# Patient Record
Sex: Female | Born: 1990 | Race: White | Hispanic: No | Marital: Married | Smoking: Never smoker
Health system: Southern US, Community
[De-identification: ages and names within clinical notes are randomized; demographics above are authoritative.]

## PROBLEM LIST (undated history)

## (undated) DIAGNOSIS — Z789 Other specified health status: Secondary | ICD-10-CM

## (undated) HISTORY — PX: NO PAST SURGERIES: SHX2092

---
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
## 2010-10-30 LAB — CBC
HCT: 29.9 % — ABNORMAL LOW (ref 36.0–46.0)
Hemoglobin: 10 g/dL — ABNORMAL LOW (ref 12.0–15.0)
Hemoglobin: 10.6 g/dL — ABNORMAL LOW (ref 12.0–15.0)
MCHC: 33.7 g/dL (ref 30.0–36.0)
MCV: 82.4 fL (ref 78.0–100.0)
RBC: 3.62 MIL/uL — ABNORMAL LOW (ref 3.87–5.11)
RBC: 3.87 MIL/uL (ref 3.87–5.11)
WBC: 11.5 10*3/uL — ABNORMAL HIGH (ref 4.0–10.5)
WBC: 8.3 10*3/uL (ref 4.0–10.5)

## 2010-10-30 LAB — CCBB MATERNAL DONOR DRAW

## 2010-12-03 NOTE — H&P (Signed)
NAMENEETI, KNUDTSON         ACCOUNT NO.:  [id number]   MEDICAL RECORD NO.:  [id number]          PATIENT TYPE:  INP   LOCATION:                                FACILITY:  [facility]   PHYSICIAN:  Sherron Monday, MD        DATE OF BIRTH:  06/04/1991   DATE OF ADMISSION:  11/07/2008  DATE OF DISCHARGE:                              HISTORY & PHYSICAL   DIAGNOSIS:  Intrauterine pregnancy at term with favorable cervix.   PROCEDURE PLANNED:  Induction of labor with rupture of membranes and  Pitocin for augmentation.   HISTORY OF PRESENT ILLNESS:  A 20 year old G1, P0 at 32 plus weeks for  induction of labor given favorable cervix and 39 plus weeks gestation.  Her prenatal care has been largely uncomplicated.  She did however have  a late presentation for care at approximately 20 weeks.  She had an 18-  week scan that was consistent with her last menstrual period.  She  states she has had good fetal movement, no loss of fluid, vaginal  bleeding, which she described as spotting only after cervical checks,  occasional contractions.   PAST MEDICAL HISTORY:  Not significant.   PAST SURGICAL HISTORY:  Not significant.   PAST OBSTETRICAL/GYNECOLOGICAL HISTORY:  G1 is the present pregnancy.  No history of any abnormal Pap smears or sexually transmitted diseases.   MEDICATIONS:  Multivitamin.   ALLERGIES:  No known drug allergies.   SOCIAL HISTORY:  Denies alcohol, tobacco, or drug use.   FAMILY HISTORY:  Significant for heart disease in maternal grandfather,  leukemia in mother, anemia in sister and father, paternal aunt with  diabetes, maternal grandmother with breast cancer, maternal great  grandmother with breast cancer.   PRENATAL LABORATORY DATA:  Hemoglobin 12.8, platelets 202,000.  RPR  nonreactive.  Rubella equivocal.  Hepatitis B surface antigen negative.  HIV negative.  Urine culture was positive for E. coli.  Pap smear within  normal limits.  A+ antibody screen negative.   Gonorrhea negative.  Chlamydia negative.  Cystic fibrosis screen negative.  She did not  undergo any screening and she was too late to care.  Ultrasound at 18  and 5 weeks revealed normal anatomy, posterior placenta, female infant.  One-hour Glucola of 88.  Group B strep was negative.  Repeat gonorrhea  and chlamydia were negative.   PHYSICAL EXAMINATION:  VITAL SIGNS:  Stable.  She is afebrile.  GENERAL:  No apparent distress.  CARDIOVASCULAR:  Regular rate and rhythm.  LUNGS:  Clear to auscultation bilaterally.  ABDOMEN:  Soft.  Fundus is nontender.  EXTREMITIES:  Symmetric and nontender.  GU:  Vaginal exam, 3 cm dilated, 80% effaced, and -2 station with a  bulging bag of water somewhat posterior cervix.  Fetal heart tones in  the office were 132 and 140s.   ASSESSMENT:  A 20 year old G1, P0 at 73 plus 4 for induction of labor.  She will have a rupture of membranes and Pitocin to augment her labor,  expect a spontaneous vaginal delivery.  As her rubella was equivocal,  she will get an MMR postpartum.  Sherron Monday, MD  Electronically Signed     JB/MEDQ  D:  11/06/2008  T:  11/07/2008  Job:  [id number]

## 2010-12-03 NOTE — Discharge Summary (Signed)
NAMEBRYLYN, Wanda Kelley         ACCOUNT NO.:  [id number]   MEDICAL RECORD NO.:  [id number]          PATIENT TYPE:  INP   LOCATION:  9105                          [facility]ILITY:  [facility]   PHYSICIAN:  Sherron Monday, MD        DATE OF BIRTH:  June 02, 1991   DATE OF ADMISSION:  11/07/2008  DATE OF DISCHARGE:  11/09/2008                               DISCHARGE SUMMARY   ADMITTING DIAGNOSES:  Intrauterine pregnancy at term, favorable cervix  for induction of labor.   DISCHARGE DIAGNOSES:  Intrauterine pregnancy at term, favorable cervix  for induction of labor, delivered via spontaneous vaginal delivery.   HOSPITAL COURSE:  For complete H&P, please see the dictated note.  However, in brief, a 20 year old G1, P0, at 5 plus weeks for induction  of labor given favorable cervix and 39-week gestational age.  Prenatal  care has been relatively uncomplicated.  She was admitted on the 20th.  AROM was performed for clear fluid and Pitocin was started to augment  her labor.  She progressed without complication to complete +2 and  pushed for approximately 10-15 minutes.  She delivered a viable female  infant at 86 on April 20, Apgars of 8 at minute 9 at 5 minutes and  weight of 8 pounds 5 ounces.  Placenta was expressed intact.  Right  labial and periurethral lacerations were repaired with 3-0 Vicryl Rapide  in a typical fashion.  EBL was less than 500 mL.  Her postpartum course  was relatively uncomplicated.  She remained afebrile.  Vital signs  stable and in general no apparent distress.  On the day of discharge,  she was voiding well, ambulating, having normal lochia.  Her pain was  well controlled.  Her abdomen was soft.  Fundus was firm and nontender  to 3 cm below her umbilicus.  She was discharged home with routine  discharge instructions and numbers to call if any questions or problems,  as well as prescriptions for Motrin, Vicodin, and prenatal vitamins.  She will follow up in 6 weeks.   DISCHARGE INFORMATION:  She is A positive, rubella equivocal.  She is  planning on breast and bottle feeding.   Hemoglobin decreased from 10.6 to 10.0.  She will have an IUD placed at  her postpartum checkup for contraception.      Sherron Monday, MD  Electronically Signed     JB/MEDQ  D:  11/09/2008  T:  11/09/2008  Job:  [id number]

## 2011-03-28 ENCOUNTER — Other Ambulatory Visit: Payer: Self-pay | Admitting: Family Medicine

## 2011-03-28 DIAGNOSIS — N644 Mastodynia: Secondary | ICD-10-CM

## 2011-04-03 ENCOUNTER — Ambulatory Visit
Admission: RE | Admit: 2011-04-03 | Discharge: 2011-04-03 | Disposition: A | Discharge status: Home / Self Care | Payer: 59 | Source: Ambulatory Visit | Attending: Family Medicine | Admitting: Family Medicine

## 2011-04-03 DIAGNOSIS — N644 Mastodynia: Secondary | ICD-10-CM

## 2011-06-11 ENCOUNTER — Other Ambulatory Visit: Payer: Self-pay | Admitting: Family Medicine

## 2011-06-11 DIAGNOSIS — N644 Mastodynia: Secondary | ICD-10-CM

## 2011-06-11 DIAGNOSIS — N6459 Other signs and symptoms in breast: Secondary | ICD-10-CM

## 2011-06-19 ENCOUNTER — Ambulatory Visit
Admission: RE | Admit: 2011-06-19 | Discharge: 2011-06-19 | Disposition: A | Discharge status: Home / Self Care | Payer: 59 | Source: Ambulatory Visit | Attending: Family Medicine | Admitting: Family Medicine

## 2011-06-19 DIAGNOSIS — N6459 Other signs and symptoms in breast: Secondary | ICD-10-CM

## 2011-06-19 DIAGNOSIS — N644 Mastodynia: Secondary | ICD-10-CM

## 2011-11-17 LAB — OB RESULTS CONSOLE GC/CHLAMYDIA
Chlamydia: NEGATIVE
Gonorrhea: NEGATIVE

## 2011-11-17 LAB — OB RESULTS CONSOLE ABO/RH

## 2011-11-17 LAB — OB RESULTS CONSOLE ANTIBODY SCREEN: Antibody Screen: NEGATIVE

## 2012-05-14 LAB — OB RESULTS CONSOLE GBS: GBS: NEGATIVE

## 2012-06-09 ENCOUNTER — Inpatient Hospital Stay (HOSPITAL_COMMUNITY)
Admission: AD | Admit: 2012-06-09 | Discharge: 2012-06-11 | DRG: 774 | Disposition: A | Discharge status: Home / Self Care | Payer: 59 | Source: Ambulatory Visit | Attending: Obstetrics and Gynecology | Admitting: Obstetrics and Gynecology

## 2012-06-09 ENCOUNTER — Encounter (HOSPITAL_COMMUNITY): Payer: Self-pay | Admitting: *Deleted

## 2012-06-09 HISTORY — DX: Other specified health status: Z78.9

## 2012-06-09 NOTE — MAU Note (Signed)
Pt having contractions, denies leaking fluid.

## 2012-06-09 NOTE — MAU Note (Signed)
Pt states contractions started at 2220.

## 2012-06-10 ENCOUNTER — Encounter (HOSPITAL_COMMUNITY): Payer: Self-pay | Admitting: *Deleted

## 2012-06-10 LAB — CBC
MCH: 22.1 pg — ABNORMAL LOW (ref 26.0–34.0)
MCHC: 30.2 g/dL (ref 30.0–36.0)
MCV: 73.3 fL — ABNORMAL LOW (ref 78.0–100.0)
Platelets: 231 10*3/uL (ref 150–400)
RBC: 4.38 MIL/uL (ref 3.87–5.11)
RDW: 16.9 % — ABNORMAL HIGH (ref 11.5–15.5)

## 2012-06-10 LAB — RPR: RPR Ser Ql: NONREACTIVE

## 2012-06-10 MED ORDER — TETANUS-DIPHTH-ACELL PERTUSSIS 5-2.5-18.5 LF-MCG/0.5 IM SUSP: 0.5000 mL | Freq: Once | INTRAMUSCULAR | Status: DC

## 2012-06-10 MED ORDER — LACTATED RINGERS IV SOLN: INTRAVENOUS | Status: AC

## 2012-06-10 MED ORDER — WITCH HAZEL-GLYCERIN EX PADS: 1.0000 "application " | MEDICATED_PAD | CUTANEOUS | Status: DC | PRN

## 2012-06-10 MED ORDER — SENNOSIDES-DOCUSATE SODIUM 8.6-50 MG PO TABS
2.0000 | ORAL_TABLET | Freq: Every day | ORAL | Status: DC
  Administered 2012-06-10: 2 via ORAL

## 2012-06-10 MED ORDER — ONDANSETRON HCL 4 MG PO TABS: 4.0000 mg | ORAL_TABLET | ORAL | Status: DC | PRN

## 2012-06-10 MED ORDER — PRENATAL MULTIVITAMIN CH
1.0000 | ORAL_TABLET | Freq: Every day | ORAL | Status: DC
  Administered 2012-06-10 – 2012-06-11 (×2): 1 via ORAL
  Filled 2012-06-10: qty 1

## 2012-06-10 MED ORDER — DIBUCAINE 1 % RE OINT: 1.0000 "application " | TOPICAL_OINTMENT | RECTAL | Status: DC | PRN

## 2012-06-10 MED ORDER — ONDANSETRON HCL 4 MG/2ML IJ SOLN: 4.0000 mg | INTRAMUSCULAR | Status: DC | PRN

## 2012-06-10 MED ORDER — SIMETHICONE 80 MG PO CHEW: 80.0000 mg | CHEWABLE_TABLET | ORAL | Status: DC | PRN

## 2012-06-10 MED ORDER — LIDOCAINE HCL (PF) 1 % IJ SOLN
INTRAMUSCULAR | Status: AC
  Filled 2012-06-10: qty 30

## 2012-06-10 MED ORDER — MEASLES, MUMPS & RUBELLA VAC ~~LOC~~ INJ
0.5000 mL | INJECTION | Freq: Once | SUBCUTANEOUS | Status: DC
  Filled 2012-06-10: qty 0.5

## 2012-06-10 MED ORDER — BENZOCAINE-MENTHOL 20-0.5 % EX AERO
1.0000 "application " | INHALATION_SPRAY | CUTANEOUS | Status: DC | PRN
  Administered 2012-06-10: 1 via TOPICAL

## 2012-06-10 MED ORDER — LANOLIN HYDROUS EX OINT: TOPICAL_OINTMENT | CUTANEOUS | Status: DC | PRN

## 2012-06-10 MED ORDER — OXYTOCIN 40 UNITS IN LACTATED RINGERS INFUSION - SIMPLE MED
INTRAVENOUS | Status: AC
  Filled 2012-06-10: qty 1000

## 2012-06-10 MED ORDER — OXYCODONE-ACETAMINOPHEN 5-325 MG PO TABS: 1.0000 | ORAL_TABLET | ORAL | Status: DC | PRN

## 2012-06-10 MED ORDER — ZOLPIDEM TARTRATE 5 MG PO TABS: 5.0000 mg | ORAL_TABLET | Freq: Every evening | ORAL | Status: DC | PRN

## 2012-06-10 MED ORDER — OXYTOCIN 40 UNITS IN LACTATED RINGERS INFUSION - SIMPLE MED: 62.5000 mL/h | INTRAVENOUS | Status: AC | PRN

## 2012-06-10 MED ORDER — DIPHENHYDRAMINE HCL 25 MG PO CAPS: 25.0000 mg | ORAL_CAPSULE | Freq: Four times a day (QID) | ORAL | Status: DC | PRN

## 2012-06-10 MED ORDER — IBUPROFEN 600 MG PO TABS
600.0000 mg | ORAL_TABLET | Freq: Four times a day (QID) | ORAL | Status: DC
  Administered 2012-06-10 – 2012-06-11 (×6): 600 mg via ORAL
  Filled 2012-06-10 (×5): qty 1

## 2012-06-10 NOTE — Progress Notes (Signed)
Pt transferred to room 320 via wheelchair, fob at bedside. Newborn in mothers arms.

## 2012-06-10 NOTE — Progress Notes (Addendum)
Patient ID: Wanda Kelley, female   DOB: Jun 02, 1991, 22 y.o.   MRN: [id number] Delivery note:    The pt was seen in MAU and was thought to be 6 cm dilated. She was sent to L&D and shortly after arriving in the room she had ROM and felt the urge to push. She could not control the urge so she rapidly delivered a living female infant over an intact perineum. There was a loop of nuchal cord and the Apgars were 9 and 9 at 1 and 5 minutes. The placenta delivered and several long fragments of membranes were removed with forceps. The uterus felt normal. EBL 400 cc's.

## 2012-06-10 NOTE — H&P (Signed)
NAMESONAM, WANDEL         ACCOUNT NO.:  [id number]  MEDICAL RECORD NO.:  [id number]  LOCATION:  9168                          [facility]ACILITY:  [facility]  PHYSICIAN:  Malachi Pro. Ambrose Mantle, M.D. DATE OF BIRTH:  [birth year]  DATE OF ADMISSION:  06/10/2012 DATE OF DISCHARGE:                             HISTORY & PHYSICAL   PRESENT ILLNESS:  This is a 20 year old white female para 1-0-0-1, Gravida 2, [facility] June 15, 2012, admitted to the labor and delivery room, ready to deliver.  Blood group and type A positive, negative antibody.  Pap smear normal.  Rubella immune.  RPR nonreactive.  Urine culture negative.  Hepatitis B surface antigen negative, HIV negative, GC and Chlamydia negative.  Cystic fibrosis screen negative.  First trimester screen negative.  AFP is not recorded in our chart.  One-hour Glucola was 117.  Group B strep was negative.  The patient had a relatively uncomplicated prenatal course.  She came to the maternity admission unit tonight, was found to be 6 cm dilated, was transferred to labor and delivery.  On admission to labor and delivery, she had rupture of membranes, and was basically ready to deliver.  She could not control her pushing efforts, so she went ahead and delivered shortly after arriving in the bed.  PAST MEDICAL HISTORY:  No significant history.  SURGICAL HISTORY:  Negative.  ALLERGIES:  No known drug allergies.  No latex allergy.  FAMILY HISTORY:  Mother with leukemia.  Father and sister with anemia. Father with diabetes.  OBSTETRIC HISTORY:  In 2010, the patient delivered an 8 pound 5 ounce female infant vaginally.  SOCIAL HISTORY:  She denied alcohol, tobacco, and illicit substance abuse.  PHYSICAL EXAMINATION:  VITAL SIGNS:  On admission, temperature 97.7, pulse was 150, respirations 18, blood pressure 116/52. HEART:  Normal size and sounds.  No murmurs. LUNGS:  Clear to auscultation. ABDOMEN:  Fundal height was normal for term pregnancy.  Cervix 10  cm dilated, vertex on the perineum.  ADMITTING IMPRESSION:  Intrauterine pregnancy at 39+ weeks.  Second stage of labor.  The patient is prepared for delivery.     Malachi Pro. Ambrose Mantle, M.D.     TFH/MEDQ  D:  06/10/2012  T:  06/10/2012  Job:  [id number]

## 2012-06-10 NOTE — Progress Notes (Signed)
Patient ID: Wanda Kelley, female   DOB: [birth year], 21 y.o.   MRN: [id number] DOD no problems

## 2012-06-11 MED ORDER — IBUPROFEN 600 MG PO TABS: 600.0000 mg | ORAL_TABLET | Freq: Four times a day (QID) | ORAL | Status: DC | PRN

## 2012-06-11 MED ORDER — OXYCODONE-ACETAMINOPHEN 5-325 MG PO TABS: 1.0000 | ORAL_TABLET | Freq: Four times a day (QID) | ORAL | Status: DC | PRN

## 2012-06-11 NOTE — Discharge Summary (Signed)
Wanda Kelley, HADLOCK         ACCOUNT NO.:  [id number]  MEDICAL RECORD NO.:  [id number]  LOCATION:  9320                          [facility]ILITY:  [facility]  PHYSICIAN:  Malachi Pro. Ambrose Mantle, M.D. DATE OF BIRTH:  [birth year]  DATE OF ADMISSION:  06/10/2012 DATE OF DISCHARGE:  06/11/2012                              DISCHARGE SUMMARY   A 21 year old white female, para 1-0-0-1, gravida 2, [facility] June 15, 2012, admitted to the hospital, ready to deliver.  She was found to be 6 cm in maternity admission unit, was transferred to labor and delivery, was found to be fully dilated with the head on the perineum after rupture of membranes.  She could not control her urge to push, so rapid delivery of living female infant over an intact perineum.  There was a loop of nuchal cord.  Apgars were 9 and 9 at 1 and 5 minutes.  The placenta delivered and several long fragments of membranes were removed with forceps.  The uterus felt normal.  Blood loss about 400 mL. Postpartum the patient did well.  Dr. Ambrose Mantle was in attendance and the patient was discharged at her request on the first postpartum day. Hemoglobin was 9.7, hematocrit 32.1, white count 17,500, platelet count 231,000.  FINAL DIAGNOSES:  Intrauterine pregnancy at 39+ weeks, delivered vertex.  OPERATION:  Spontaneous delivery vertex.  FINAL CONDITION:  Improved.  Instructions include our regular discharge instruction booklet as well as the after visit summary, Motrin 600 mg, 15 tablets 1 every 6 hours as needed for pain and Percocet 5/325, 15 tablets, 1 every 6 hours as needed for pain given at discharge.  The patient is advised to take iron every day and return to the office in 6 weeks for followup examination.     Malachi Pro. Ambrose Mantle, M.D.     TFH/MEDQ  D:  06/11/2012  T:  06/11/2012  Job:  [id number]

## 2012-06-11 NOTE — Progress Notes (Signed)
Patient ID: Wanda Kelley, female   DOB: 05/20/1991, 21 y.o.   MRN: [id number] #1 afebrile BP normal for d/c

## 2014-05-22 ENCOUNTER — Encounter (HOSPITAL_COMMUNITY): Payer: Self-pay | Admitting: *Deleted

## 2015-02-06 LAB — OB RESULTS CONSOLE ABO/RH: RH Type: POSITIVE

## 2015-02-06 LAB — OB RESULTS CONSOLE ANTIBODY SCREEN: Antibody Screen: NEGATIVE

## 2015-02-06 LAB — OB RESULTS CONSOLE RPR: RPR: NONREACTIVE

## 2015-02-06 LAB — OB RESULTS CONSOLE HEPATITIS B SURFACE ANTIGEN: HEP B S AG: NEGATIVE

## 2015-02-06 LAB — OB RESULTS CONSOLE RUBELLA ANTIBODY, IGM: RUBELLA: NON-IMMUNE/NOT IMMUNE

## 2015-02-06 LAB — OB RESULTS CONSOLE HIV ANTIBODY (ROUTINE TESTING): HIV: NONREACTIVE

## 2015-07-12 LAB — OB RESULTS CONSOLE GBS: GBS: NEGATIVE

## 2015-07-12 LAB — OB RESULTS CONSOLE GC/CHLAMYDIA
CHLAMYDIA, DNA PROBE: NEGATIVE
GC PROBE AMP, GENITAL: NEGATIVE

## 2015-07-22 NOTE — L&D Delivery Note (Signed)
Delivery Note S/p AROM pt progressed quickly to complete dilation and pushed twice.  At 11:40 PM a healthy female was delivered via Vaginal, Spontaneous Delivery (Presentation: Middle Occiput Anterior).  APGAR: 9, 9; weight pending.   Placenta status: Intact, Spontaneous.  Cord: 3 vessels with the following complications: None.  Anesthesia: None  Episiotomy: None Lacerations: None Suture Repair:n/a Est. Blood Loss (mL):  Mom to postpartum.  Baby to Couplet care / Skin to Skin. D/w pt circumcision and desires to proceed in hospital [name] W 08/11/2015, 12:13 AM

## 2015-08-10 ENCOUNTER — Encounter (HOSPITAL_COMMUNITY): Payer: Self-pay | Admitting: *Deleted

## 2015-08-10 ENCOUNTER — Inpatient Hospital Stay (HOSPITAL_COMMUNITY)
Admission: AD | Admit: 2015-08-10 | Discharge: 2015-08-12 | DRG: 775 | Disposition: A | Discharge status: Home / Self Care | Payer: 59 | Source: Ambulatory Visit | Attending: Obstetrics and Gynecology | Admitting: Obstetrics and Gynecology

## 2015-08-10 DIAGNOSIS — Z3A39 39 weeks gestation of pregnancy: Secondary | ICD-10-CM | POA: Diagnosis not present

## 2015-08-10 DIAGNOSIS — IMO0001 Reserved for inherently not codable concepts without codable children: Secondary | ICD-10-CM

## 2015-08-10 DIAGNOSIS — Z833 Family history of diabetes mellitus: Secondary | ICD-10-CM | POA: Diagnosis not present

## 2015-08-10 HISTORY — DX: Other specified health status: Z78.9

## 2015-08-10 MED ORDER — ONDANSETRON HCL 4 MG/2ML IJ SOLN: 4.0000 mg | Freq: Four times a day (QID) | INTRAMUSCULAR | Status: DC | PRN

## 2015-08-10 MED ORDER — OXYCODONE-ACETAMINOPHEN 5-325 MG PO TABS: 1.0000 | ORAL_TABLET | ORAL | Status: DC | PRN

## 2015-08-10 MED ORDER — OXYTOCIN 10 UNIT/ML IJ SOLN
INTRAMUSCULAR | Status: AC
  Administered 2015-08-10: 10 [IU]
  Filled 2015-08-10: qty 2

## 2015-08-10 MED ORDER — LACTATED RINGERS IV SOLN: INTRAVENOUS | Status: DC

## 2015-08-10 MED ORDER — FLEET ENEMA 7-19 GM/118ML RE ENEM: 1.0000 | ENEMA | RECTAL | Status: DC | PRN

## 2015-08-10 MED ORDER — ACETAMINOPHEN 325 MG PO TABS: 650.0000 mg | ORAL_TABLET | ORAL | Status: DC | PRN

## 2015-08-10 MED ORDER — OXYTOCIN BOLUS FROM INFUSION: 500.0000 mL | INTRAVENOUS | Status: DC

## 2015-08-10 MED ORDER — OXYCODONE-ACETAMINOPHEN 5-325 MG PO TABS: 2.0000 | ORAL_TABLET | ORAL | Status: DC | PRN

## 2015-08-10 MED ORDER — LACTATED RINGERS IV SOLN: 500.0000 mL | INTRAVENOUS | Status: DC | PRN

## 2015-08-10 MED ORDER — LIDOCAINE HCL (PF) 1 % IJ SOLN
30.0000 mL | INTRAMUSCULAR | Status: DC | PRN
  Filled 2015-08-10: qty 30

## 2015-08-10 MED ORDER — LACTATED RINGERS IV SOLN
2.5000 [IU]/h | INTRAVENOUS | Status: DC
  Filled 2015-08-10: qty 4

## 2015-08-10 MED ORDER — CITRIC ACID-SODIUM CITRATE 334-500 MG/5ML PO SOLN: 30.0000 mL | ORAL | Status: DC | PRN

## 2015-08-10 NOTE — MAU Note (Signed)
Was seen in office today and membranes stripped. Was 4cm prior ck but did not tell pt what cervix was today. Ctxs since 2015 tonight. Some bloody show

## 2015-08-10 NOTE — H&P (Signed)
Wanda Kelley is a 25 y.o. female at 28 6/7 weeks (EDD 08/11/15 by 12 week [place]) presenting for active labor with cervix 5-6cm.  Prenatal care uneventful. Maternal Medical History:  Reason for admission: Contractions.   Contractions: Onset was 3-5 hours ago.   Frequency: regular.   Perceived severity is moderate.    Fetal activity: Perceived fetal activity is normal.    Prenatal Complications - Diabetes: none.    OB History    Gravida Para Term Preterm AB TAB SAB Ectopic Multiple Living   NSVD x 2 (8+bs, 7+lbs)  Past Medical History  Diagnosis Date  . No pertinent past medical history   . Medical history non-contributory    Past Surgical History  Procedure Laterality Date  . No past surgeries     Family History: family history includes Cancer in her mother; Diabetes in her father. Social History:  reports that she has never smoked. She does not have any smokeless tobacco history on file. Her alcohol and drug histories are not on file.   Prenatal Transfer Tool  Maternal Diabetes: No Genetic Screening: Declined Maternal Ultrasounds/Referrals: Normal Fetal Ultrasounds or other Referrals:  None Maternal Substance Abuse:  No Significant Maternal Medications:  None Significant Maternal Lab Results:  None Other Comments:  None  Review of Systems  Gastrointestinal: Positive for abdominal pain.    Dilation: 5.5 Effacement (%): 90 Station: -1 Exam by:: K.Wilson,RN Blood pressure 129/85, pulse 99, temperature 98.4 F (36.9 C), temperature source Oral, resp. rate 18, height  (1.626 m), weight 82.464 kg (181 lb 12.8 oz), unknown if currently breastfeeding. Maternal Exam:  Uterine Assessment: Contraction strength is moderate.  Contraction frequency is regular.   Abdomen: Fetal presentation: vertex  Introitus: Normal vulva. Normal vagina.    Physical Exam  Constitutional: She appears well-developed and well-nourished.  Cardiovascular: Normal  rate and regular rhythm.   Respiratory: Effort normal.  GI: Soft.  Genitourinary: Vagina normal.  Neurological: She is alert.  Psychiatric: She has a normal mood and affect.    Prenatal labs: ABO, Rh: A/Positive/-- (07/19 0000) Antibody: Negative (07/19 0000) Rubella: Nonimmune (07/19 0000) RPR: Nonreactive (07/19 0000)  HBsAg: Negative (07/19 0000)  HIV: Non-reactive (07/19 0000)  GBS: Negative (12/22 0000)  Declined genetics One hour GCT 99  Assessment/Plan: Admitted and will AROM and follow progress. PT declines pain medication.   Oliver Pila 08/10/2015, 11:29 PM

## 2015-08-11 ENCOUNTER — Encounter (HOSPITAL_COMMUNITY): Payer: Self-pay | Admitting: *Deleted

## 2015-08-11 LAB — TYPE AND SCREEN
ABO/RH(D): A POS
Antibody Screen: NEGATIVE

## 2015-08-11 LAB — CBC
HCT: 33.6 % — ABNORMAL LOW (ref 36.0–46.0)
HCT: 34.4 % — ABNORMAL LOW (ref 36.0–46.0)
HEMOGLOBIN: 10.5 g/dL — AB (ref 12.0–15.0)
Hemoglobin: 11 g/dL — ABNORMAL LOW (ref 12.0–15.0)
MCH: 24.4 pg — ABNORMAL LOW (ref 26.0–34.0)
MCH: 24.7 pg — AB (ref 26.0–34.0)
MCHC: 31.3 g/dL (ref 30.0–36.0)
MCHC: 32 g/dL (ref 30.0–36.0)
MCV: 78.1 fL (ref 78.0–100.0)
MCV: 77.1 fL — AB (ref 78.0–100.0)
PLATELETS: 237 10*3/uL (ref 150–400)
PLATELETS: 204 10*3/uL (ref 150–400)
RBC: 4.3 MIL/uL (ref 3.87–5.11)
RBC: 4.46 MIL/uL (ref 3.87–5.11)
RDW: 14.8 % (ref 11.5–15.5)
RDW: 14.9 % (ref 11.5–15.5)
WBC: 14.9 10*3/uL — ABNORMAL HIGH (ref 4.0–10.5)
WBC: 18.2 10*3/uL — AB (ref 4.0–10.5)

## 2015-08-11 LAB — ABO/RH: ABO/RH(D): A POS

## 2015-08-11 LAB — RPR: RPR Ser Ql: NONREACTIVE

## 2015-08-11 MED ORDER — IBUPROFEN 600 MG PO TABS
600.0000 mg | ORAL_TABLET | Freq: Four times a day (QID) | ORAL | Status: DC
  Administered 2015-08-11 – 2015-08-12 (×6): 600 mg via ORAL
  Filled 2015-08-11 (×6): qty 1

## 2015-08-11 MED ORDER — WITCH HAZEL-GLYCERIN EX PADS: 1.0000 "application " | MEDICATED_PAD | CUTANEOUS | Status: DC | PRN

## 2015-08-11 MED ORDER — SENNOSIDES-DOCUSATE SODIUM 8.6-50 MG PO TABS
2.0000 | ORAL_TABLET | ORAL | Status: DC
  Administered 2015-08-12: 2 via ORAL
  Filled 2015-08-11: qty 2

## 2015-08-11 MED ORDER — ONDANSETRON HCL 4 MG/2ML IJ SOLN: 4.0000 mg | INTRAMUSCULAR | Status: DC | PRN

## 2015-08-11 MED ORDER — ONDANSETRON HCL 4 MG PO TABS: 4.0000 mg | ORAL_TABLET | ORAL | Status: DC | PRN

## 2015-08-11 MED ORDER — OXYCODONE HCL 5 MG PO TABS: 5.0000 mg | ORAL_TABLET | ORAL | Status: DC | PRN

## 2015-08-11 MED ORDER — LANOLIN HYDROUS EX OINT: TOPICAL_OINTMENT | CUTANEOUS | Status: DC | PRN

## 2015-08-11 MED ORDER — TETANUS-DIPHTH-ACELL PERTUSSIS 5-2.5-18.5 LF-MCG/0.5 IM SUSP: 0.5000 mL | Freq: Once | INTRAMUSCULAR | Status: DC

## 2015-08-11 MED ORDER — PRENATAL MULTIVITAMIN CH
1.0000 | ORAL_TABLET | Freq: Every day | ORAL | Status: DC
  Administered 2015-08-11 – 2015-08-12 (×2): 1 via ORAL
  Filled 2015-08-11 (×2): qty 1

## 2015-08-11 MED ORDER — OXYCODONE HCL 5 MG PO TABS: 10.0000 mg | ORAL_TABLET | ORAL | Status: DC | PRN

## 2015-08-11 MED ORDER — DIPHENHYDRAMINE HCL 25 MG PO CAPS: 25.0000 mg | ORAL_CAPSULE | Freq: Four times a day (QID) | ORAL | Status: DC | PRN

## 2015-08-11 MED ORDER — DIBUCAINE 1 % RE OINT: 1.0000 "application " | TOPICAL_OINTMENT | RECTAL | Status: DC | PRN

## 2015-08-11 MED ORDER — ZOLPIDEM TARTRATE 5 MG PO TABS: 5.0000 mg | ORAL_TABLET | Freq: Every evening | ORAL | Status: DC | PRN

## 2015-08-11 MED ORDER — MEASLES, MUMPS & RUBELLA VAC ~~LOC~~ INJ: 0.5000 mL | INJECTION | Freq: Once | SUBCUTANEOUS | Status: DC

## 2015-08-11 MED ORDER — SIMETHICONE 80 MG PO CHEW: 80.0000 mg | CHEWABLE_TABLET | ORAL | Status: DC | PRN

## 2015-08-11 MED ORDER — BENZOCAINE-MENTHOL 20-0.5 % EX AERO: 1.0000 "application " | INHALATION_SPRAY | CUTANEOUS | Status: DC | PRN

## 2015-08-11 MED ORDER — ACETAMINOPHEN 325 MG PO TABS: 650.0000 mg | ORAL_TABLET | ORAL | Status: DC | PRN

## 2015-08-11 NOTE — Discharge Summary (Signed)
Obstetric Discharge Summary Reason for Admission: onset of labor Prenatal Procedures: none Intrapartum Procedures: spontaneous vaginal delivery Postpartum Procedures: none Complications-Operative and Postpartum: none HEMOGLOBIN  Date Value Ref Range Status  08/11/2015 10.5* 12.0 - 15.0 g/dL Final   HCT  Date Value Ref Range Status  08/11/2015 33.6* 36.0 - 46.0 % Final    Physical Exam:  General: alert and cooperative Lochia: appropriate Uterine Fundus: firm   Discharge Diagnoses: Term Pregnancy-delivered  Discharge Information: Date: 08/12/2015 Activity: pelvic rest Diet: routine Medications: Ibuprofen Condition: improved Instructions: refer to practice specific booklet Discharge to: home Follow-up Information    Follow up with Oliver Pila, MD In 6 weeks.   Specialty:  Obstetrics and Gynecology   Why:  postpartum   Contact information:   [address] [place] [address] [phone]       Newborn Data: Live born female  Birth Weight: 8 lb 7.1 oz (3830 g) APGAR: 9, 9  Home with mother.  Oliver Pila 08/12/2015, 10:03 AM

## 2015-08-11 NOTE — Progress Notes (Signed)
Post Partum Day 1 Subjective: no complaints and tolerating PO  Objective: Blood pressure 113/62, pulse 84, temperature 98.2 F (36.8 C), temperature source Oral, resp. rate 18, height  (1.626 m), weight 82.464 kg (181 lb 12.8 oz), SpO2 99 %, unknown if currently breastfeeding.  Physical Exam:  General: alert and cooperative Lochia: appropriate Uterine Fundus: firm    Recent Labs  08/10/15 2330 08/11/15 0605  HGB 11.0* 10.5*  HCT 34.4* 33.6*    Assessment/Plan: Plan for discharge tomorrow, circumcision tomorrow AM   LOS: 1 day   Wanda Kelley 08/11/2015, 11:05 AM

## 2015-08-11 NOTE — Lactation Note (Signed)
This note was copied from the chart of Wanda Tiffiany Sparger. Lactation Consultation Note Initial visit at 17 hours of age.  Mom reports a good feeding this am and then baby has been sleepy.  Explained to mom that is normal and baby may cluster feed later.  Mom plans to continue putting baby STS for feeding attempts.  Baby awakened fussy and assisted mom with latch.  Mom does hand expression with small drop noted.  Encouraged mom to place hands behind areola to work all of her breast when hand expressing.  Mom has large breasts with compressible tissue.  Mom prefers to put blanket over her and baby while other siblings are in the room as she is learning.  Mom prefers cradle hold, but unable to get a deep latch.  Assisted with cross cradle with better latch.  Baby does not maintain feeding well, stimulation needed for about 5 minutes of on and off sucking.   Baby remains STS with mom. [facility] LC resources given and discussed.  Encouraged to feed with early cues on demand.  Baby should feed 8-12 times/ 24 hours.  Early newborn behavior discussed.  Mom to call for assist as needed.       Patient Name: Wanda Kelley Date: 08/11/2015 Reason for consult: Initial assessment   Maternal Data Has patient been taught Hand Expression?: Yes Does the patient have breastfeeding experience prior to this delivery?: Yes  Feeding Feeding Type: Breast Fed Length of feed: 5 min  LATCH Score/Interventions Latch: Repeated attempts needed to sustain latch, nipple held in mouth throughout feeding, stimulation needed to elicit sucking reflex. Intervention(s): Adjust position;Assist with latch;Breast massage;Breast compression  Audible Swallowing: None  Type of Nipple: Everted at rest and after stimulation  Comfort (Breast/Nipple): Soft / non-tender     Hold (Positioning): Assistance needed to correctly position infant at breast and maintain latch. Intervention(s): Breastfeeding basics  reviewed;Support Pillows;Position options;Skin to skin  LATCH Score: 6  Lactation Tools Discussed/Used     Consult Status Consult Status: Follow-up Date: 08/12/15 Follow-up type: In-patient    Wanda Kelley 08/11/2015, 4:43 PM

## 2015-08-12 MED ORDER — IBUPROFEN 600 MG PO TABS: 600.0000 mg | ORAL_TABLET | Freq: Four times a day (QID) | ORAL | Status: AC

## 2015-08-12 MED ORDER — OXYCODONE HCL 5 MG PO TABS
5.0000 mg | ORAL_TABLET | ORAL | Status: DC | PRN
Start: 2015-08-12 — End: 2015-08-12

## 2015-08-12 NOTE — Progress Notes (Signed)
Post Partum Day 2 Subjective: no complaints and tolerating PO  Objective: Blood pressure 110/59, pulse 72, temperature 98.4 F (36.9 C), temperature source Oral, resp. rate 18, height  (1.626 m), weight 82.464 kg (181 lb 12.8 oz), SpO2 98 %, unknown if currently breastfeeding.  Physical Exam:  General: alert and cooperative Lochia: appropriate Uterine Fundus: firm    Recent Labs  08/10/15 2330 08/11/15 0605  HGB 11.0* 10.5*  HCT 34.4* 33.6*    Assessment/Plan: Discharge home   LOS: 2 days   Jo Cerone W 08/12/2015, 10:01 AM

## 2015-08-12 NOTE — Lactation Note (Signed)
This note was copied from the chart of Wanda Afia Vohs. Lactation Consultation Note  Patient Name: Wanda Kelley [id number] Date: 1/22/2017ding. She is 34 hours pot partum. The baby had 65 weight loss at around 24 hours of age, but had had 9 voids and 6 stools since birhth, and stools are already transitioning to brown color. Mom denies and breast discomfort. Baby asleep in crib at this time, post circ. Mom knows to call for questions/cocnerns.    Maternal Data    Feeding Feeding Type: Breast Fed Length of feed: 10 min  LATCH Score/Interventions                      Lactation Tools Discussed/Used     Consult Status Consult Status: Complete Follow-up type: Call as needed    Wanda Kelley 08/12/2015, 9:57 AM

## 2016-12-09 DIAGNOSIS — R2242 Localized swelling, mass and lump, left lower limb: Secondary | ICD-10-CM | POA: Diagnosis not present

## 2016-12-11 ENCOUNTER — Other Ambulatory Visit: Payer: Self-pay | Admitting: Family Medicine

## 2016-12-11 DIAGNOSIS — IMO0002 Reserved for concepts with insufficient information to code with codable children: Secondary | ICD-10-CM

## 2016-12-11 DIAGNOSIS — R229 Localized swelling, mass and lump, unspecified: Principal | ICD-10-CM

## 2016-12-18 ENCOUNTER — Ambulatory Visit
Admission: RE | Admit: 2016-12-18 | Discharge: 2016-12-18 | Disposition: A | Discharge status: Home / Self Care | Payer: 59 | Source: Ambulatory Visit | Attending: Family Medicine | Admitting: Family Medicine

## 2016-12-18 DIAGNOSIS — R229 Localized swelling, mass and lump, unspecified: Principal | ICD-10-CM

## 2016-12-18 DIAGNOSIS — IMO0002 Reserved for concepts with insufficient information to code with codable children: Secondary | ICD-10-CM

## 2016-12-22 ENCOUNTER — Other Ambulatory Visit: Payer: Self-pay | Admitting: Family Medicine

## 2016-12-22 DIAGNOSIS — R2242 Localized swelling, mass and lump, left lower limb: Secondary | ICD-10-CM

## 2017-01-02 ENCOUNTER — Ambulatory Visit
Admission: RE | Admit: 2017-01-02 | Discharge: 2017-01-02 | Disposition: A | Discharge status: Home / Self Care | Payer: 59 | Source: Ambulatory Visit | Attending: Family Medicine | Admitting: Family Medicine

## 2017-01-02 DIAGNOSIS — R2242 Localized swelling, mass and lump, left lower limb: Secondary | ICD-10-CM

## 2017-01-02 DIAGNOSIS — M79652 Pain in left thigh: Secondary | ICD-10-CM | POA: Diagnosis not present

## 2017-01-02 MED ORDER — GADOBENATE DIMEGLUMINE 529 MG/ML IV SOLN
13.0000 mL | Freq: Once | INTRAVENOUS | Status: AC | PRN
  Administered 2017-01-02: 13 mL via INTRAVENOUS

## 2018-05-23 IMAGING — MR MR FEMUR*L* WO/W CM
5 of 9 series · 20 of 40 positions shown · IV contrast (13 ML MULTIHANCE)
Comparison: None.

CLINICAL DATA: Lumbar along the side of the left thigh for the past
3 years without known injury. Increase in size and pain. Area was
marked.

EXAM:
MR OF THE LEFT LOWER EXTREMITY WITHOUT AND WITH CONTRAST
TECHNIQUE: Multiplanar, multisequence MR imaging of the left thigh was
performed both before and after administration of intravenous
contrast.
CONTRAST:  13mL MULTIHANCE GADOBENATE DIMEGLUMINE 529 MG/ML IV SOLN

[Series 4: T1 · coronal · 4.0mm · 0.78mm/px · 5 of 31 slices shown (1 of 2)]
[im 1/31]
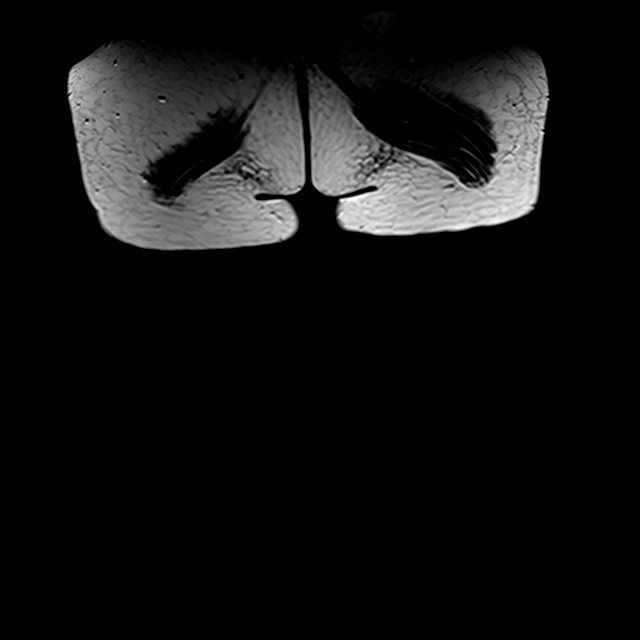
[im 8/31]
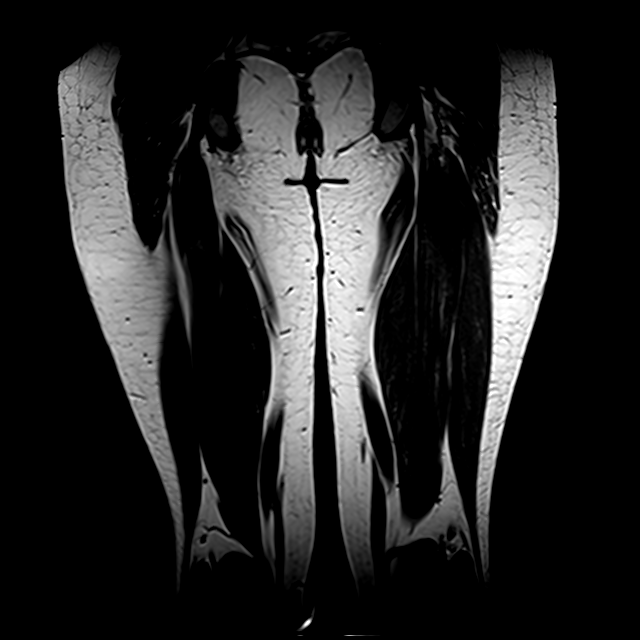
[im 16/31]
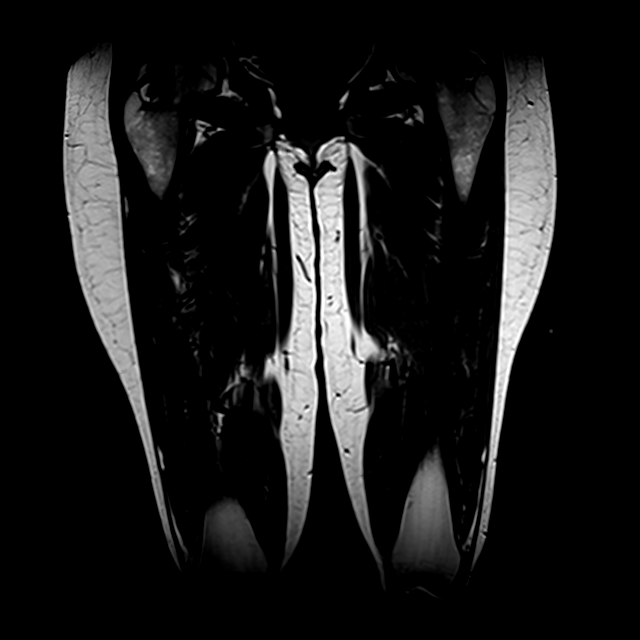
[im 23/31]
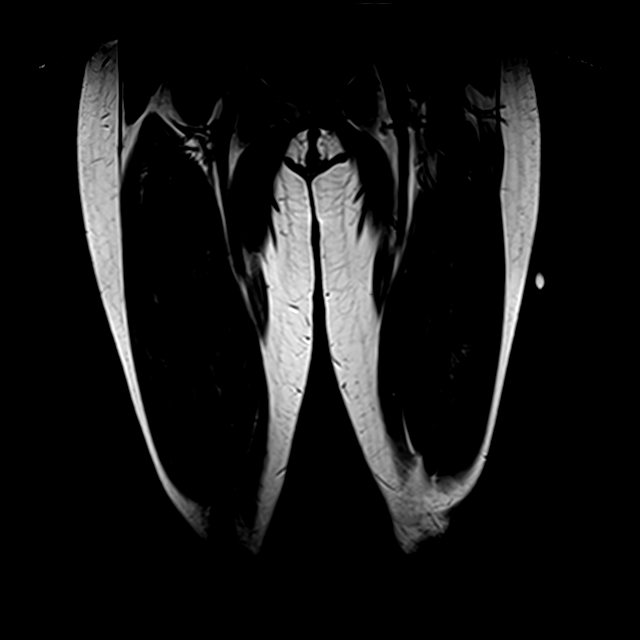
[im 31/31]
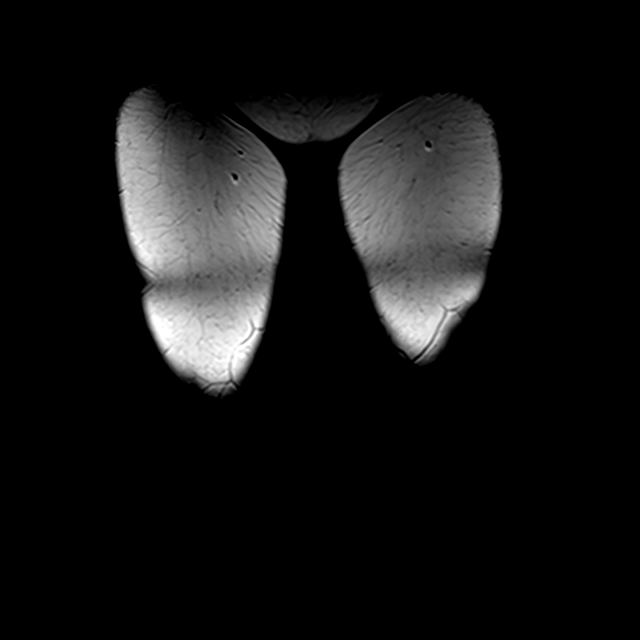

[Series 5: T1 fat-sat · coronal · 4.0mm · 0.78mm/px · 5 of 31 slices shown]
[im 1/31]
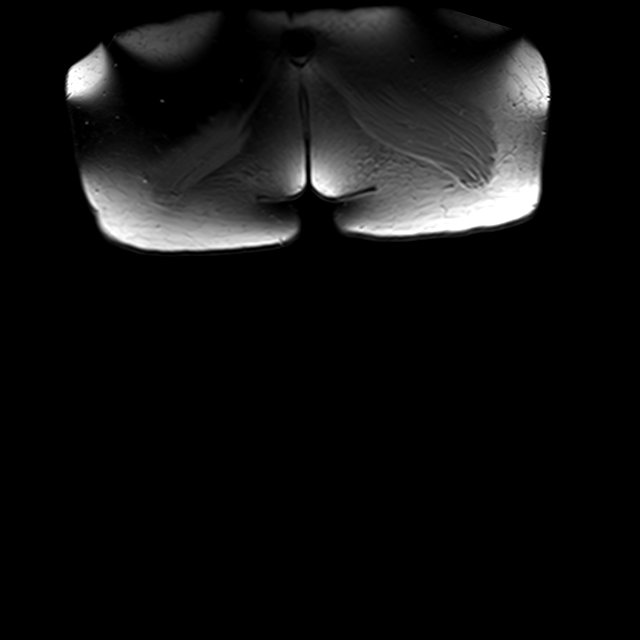
[im 8/31]
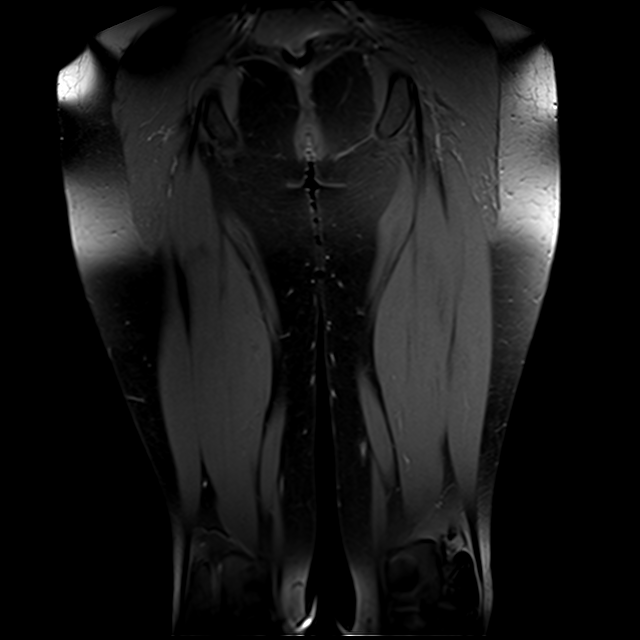
[im 16/31]
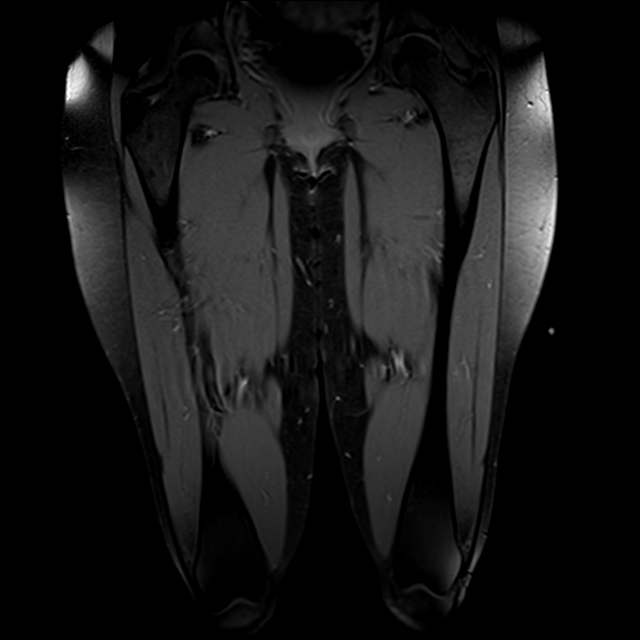
[im 23/31]
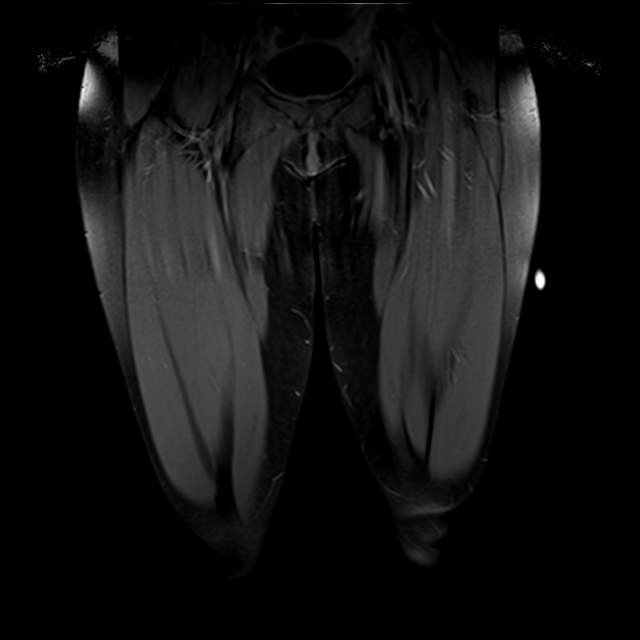
[im 31/31]
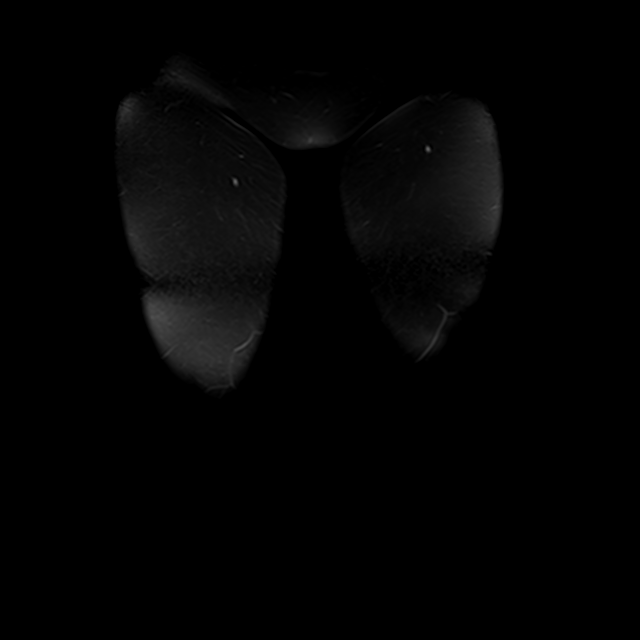

[Series 6: T2 fat-sat · coronal · 4.0mm · 1.56mm/px · 5 of 31 slices shown (1 of 2)]
[im 1/31]
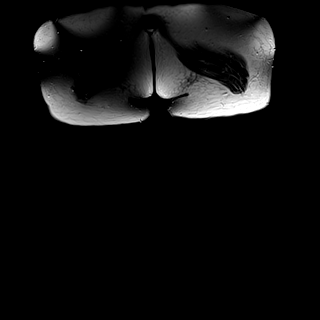
[im 8/31]
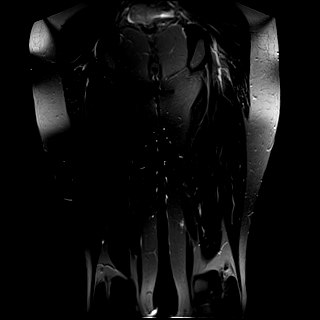
[im 16/31]
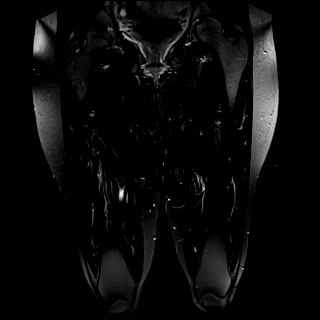
[im 23/31]
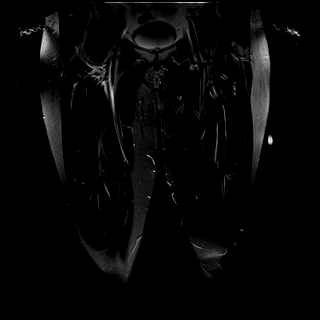
[im 31/31]
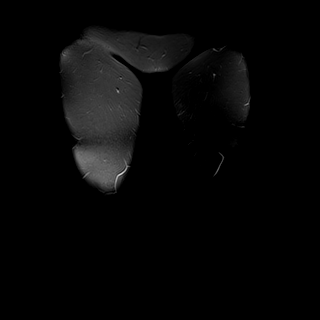

[Series 7: T1 · axial · 8.0mm · 0.62mm/px · 1 of 32 slices shown (2 of 2)]
[im 1/32]
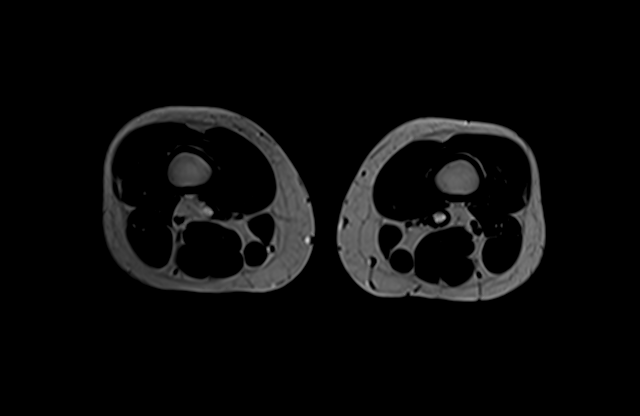

[Series 8: T2 fat-sat · axial · 8.0mm · 1.25mm/px · z∈[-178,+168]mm · 4 of 32 slices shown (2 of 2)]
[im 1/32]
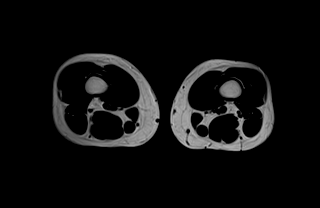
[im 11/32]
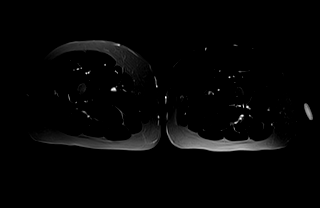
[im 21/32]
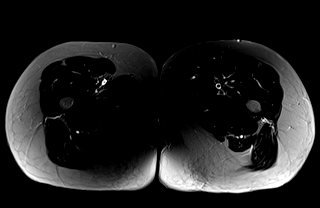
[im 32/32]
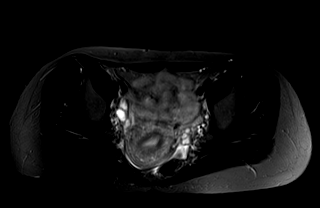

[20 of 40 positions shown; findings below may reference images not displayed]

FINDINGS: Bones/Joint/Cartilage

No marrow signal abnormality, fracture or bone destruction. Hip
joints are maintained bilaterally as are the included knees.

Ligaments

Negative

Muscles and Tendons

Symmetric appearance of the thigh musculature without muscle
herniation, edema or mass.

Soft tissues

No focal soft tissue mass or fluid collection is identified deep to
the area indicated along the lateral aspect of the left mid thigh.
Findings could be secondary to a lipoma although a discrete capsule
is not definitively identified within the subcutaneous soft tissues.
IMPRESSION: No discrete osseous abnormality or soft tissue mass noted to account
for the area of reported palpable abnormality and pain along the
lateral aspect of the left mid thigh. No abnormal fluid collection
is seen. Palpable abnormalities should be assessed independent of
the imaging and the decision to biopsy such masses should be based
on clinical grounds.

## 2019-01-10 IMAGING — US US EXTREM LOW*L* LIMITED
1 series · 7 of 7 positions shown · non-contrast
Comparison: None.

CLINICAL DATA: Palpable fullness left upper lateral thigh region,
chronic

EXAM:
ULTRASOUND LEFT THIGH LIMITED
TECHNIQUE: Ultrasound examination of the lower extremity soft tissues was
performed in the area of clinical concern.

[Series 1: us extrem low*left* limited · 0.07mm/px · 7 acquisitions, 7 frames shown]
[im 1/7]
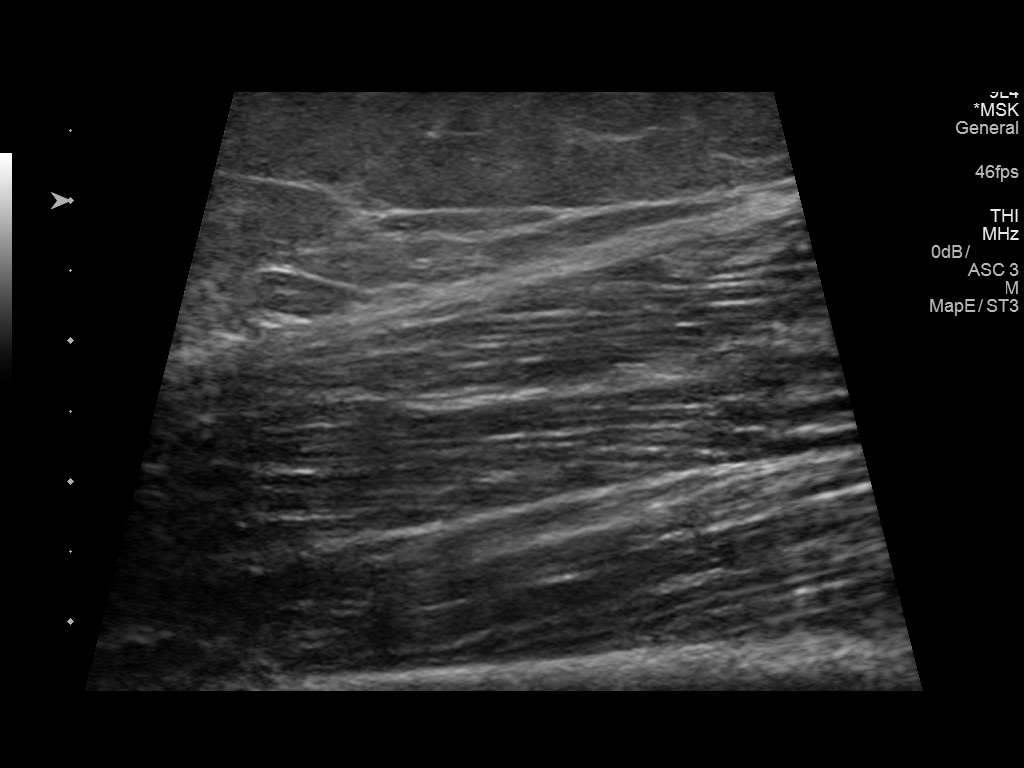
[im 2/7]
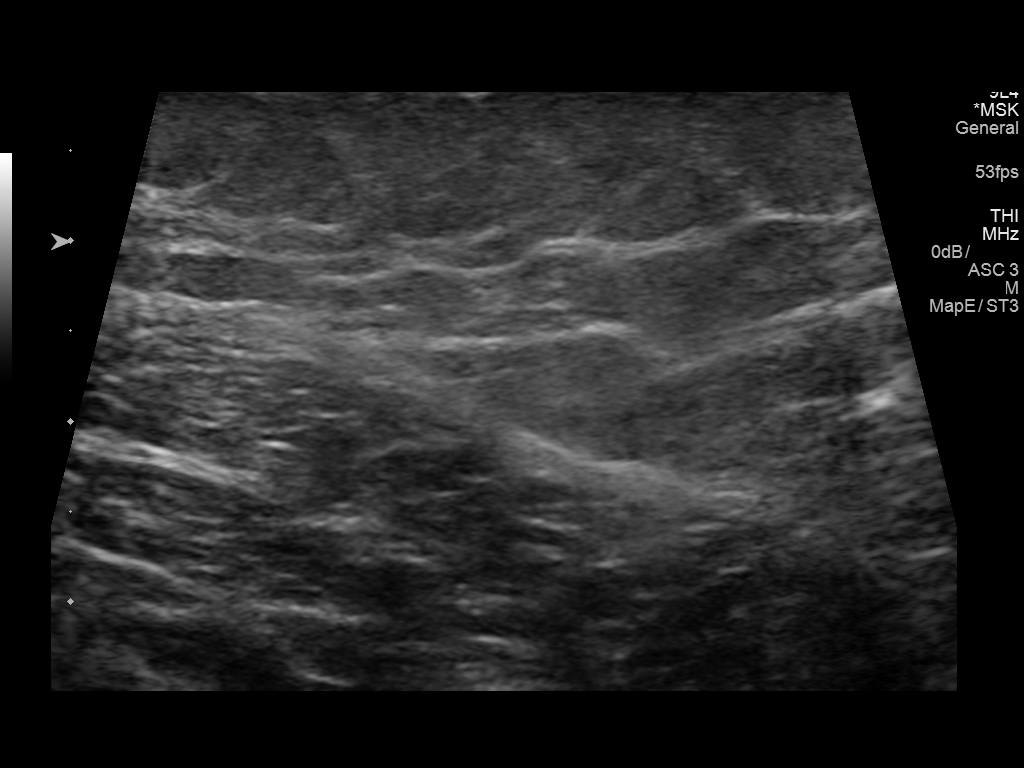
[im 3/7]
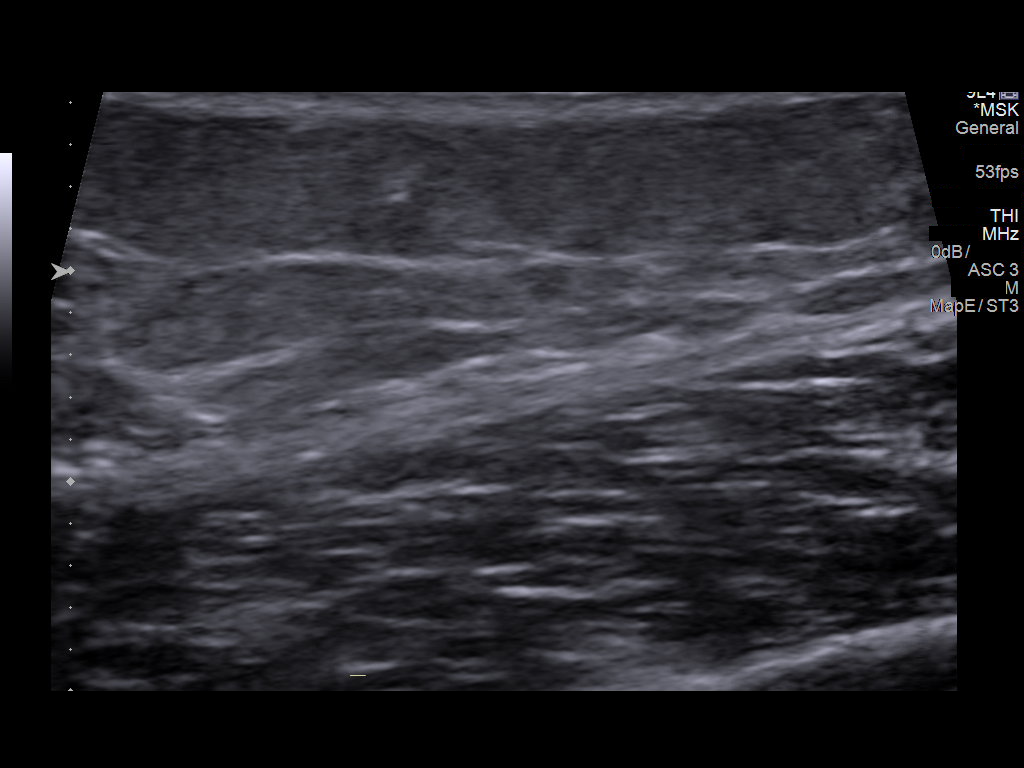
[im 4/7]
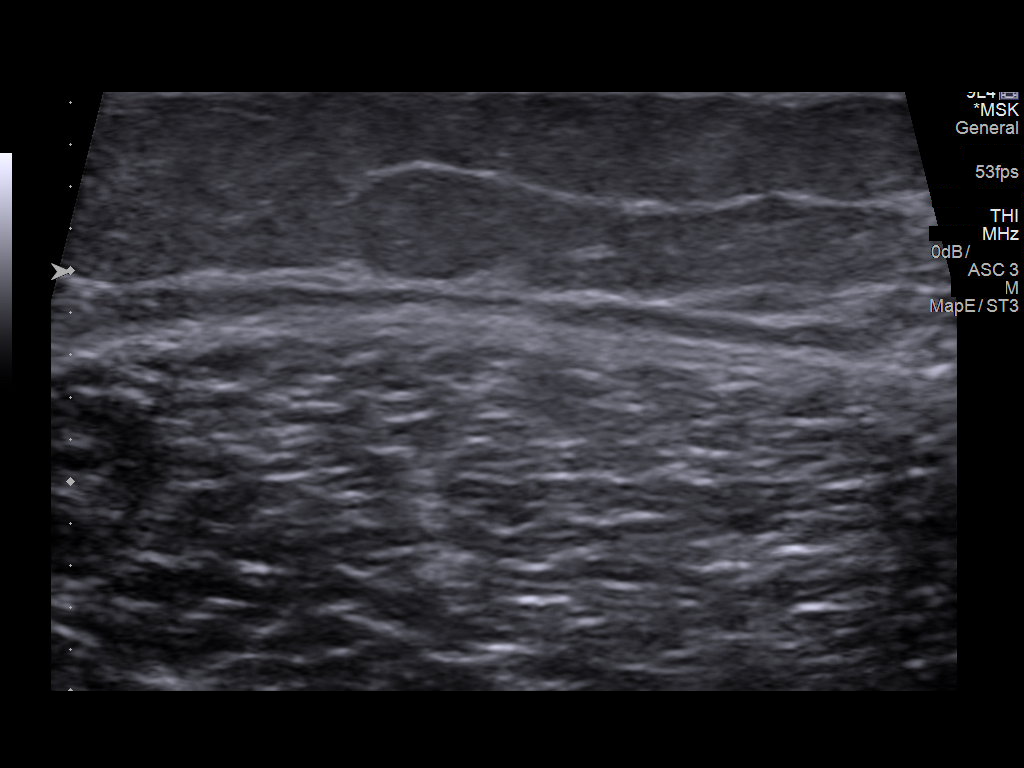
[im 5/7]
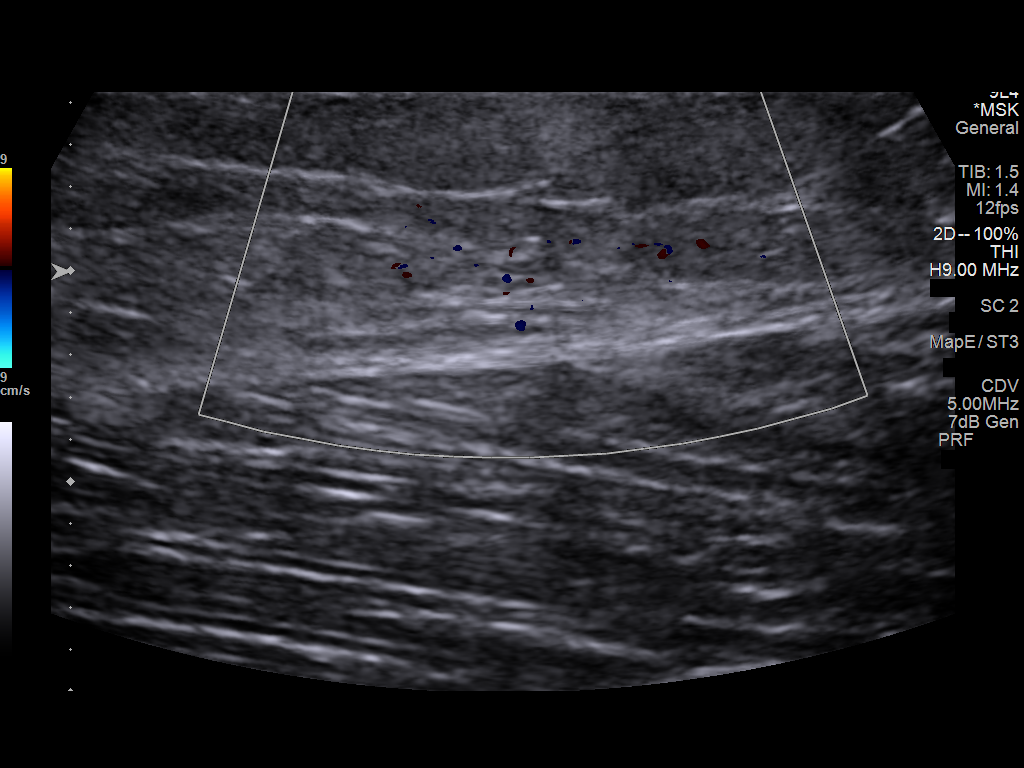
[im 6/7]
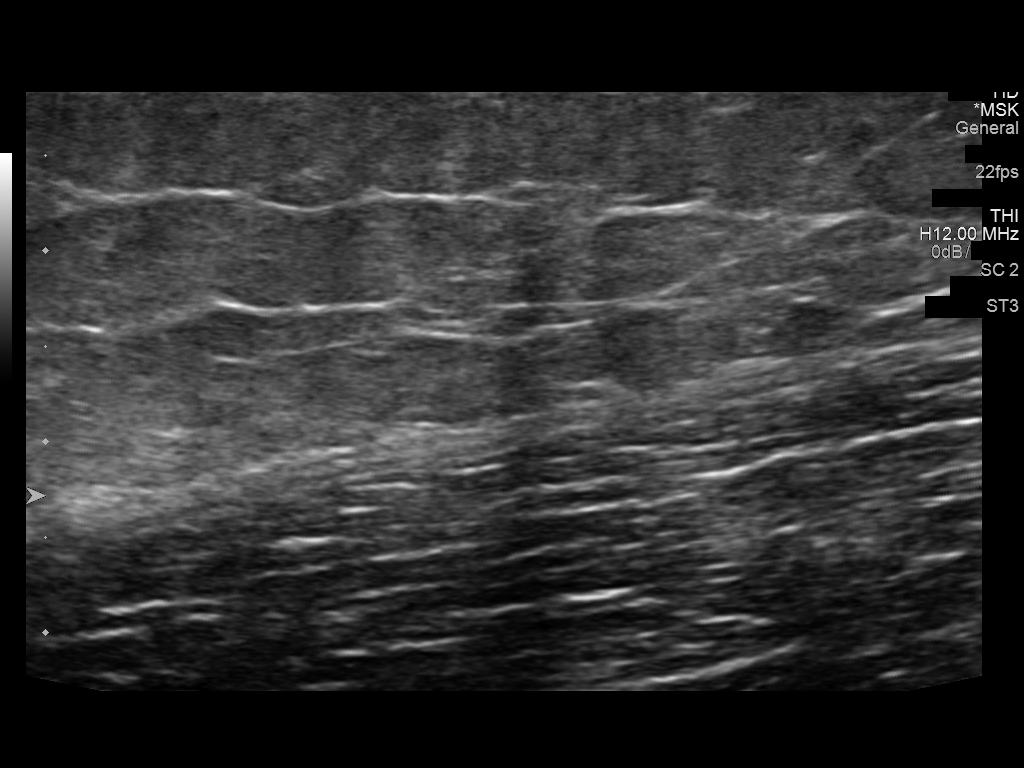
[im 7/7]
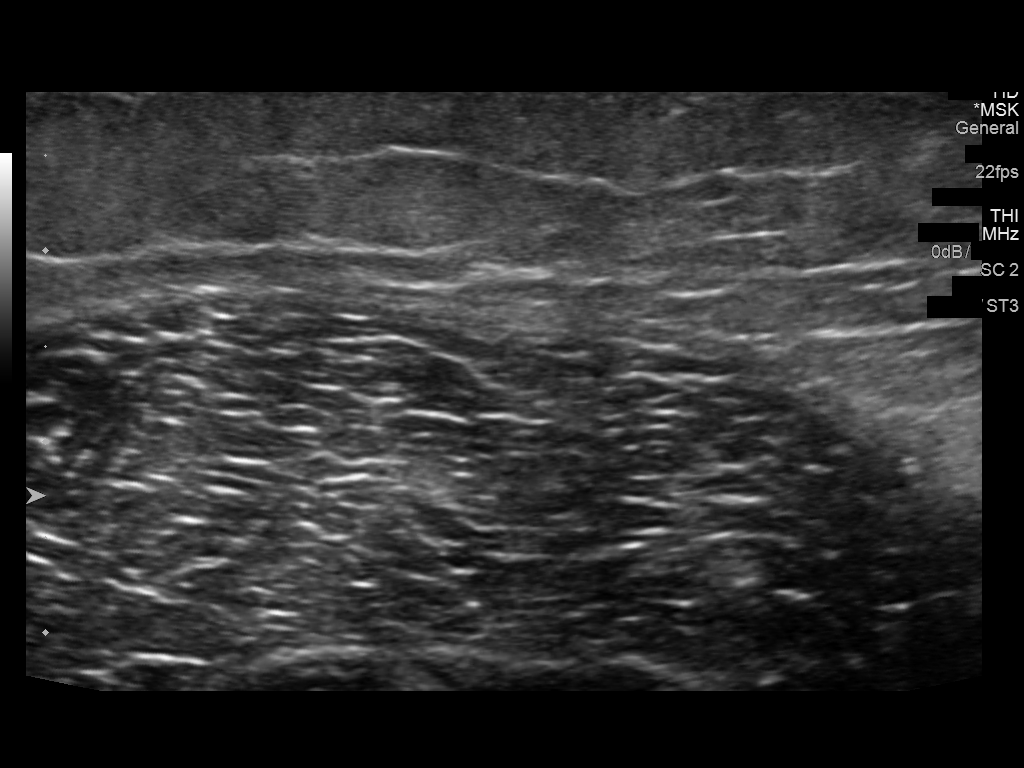

[7 of 7 positions shown; findings below may reference images not displayed]

FINDINGS: Longitudinal and transverse images of the superolateral left thigh
region were obtained in area of reported palpable fullness.
Ultrasound of this area shows normal appearing soft tissues and
muscles without mass or inflammatory focus. No abnormal fluid.
IMPRESSION: No lesions seen in the area of palpable fullness in the
superolateral left thigh region. This ultrasound examination is
considered within normal limits. If further imaging evaluation is
felt to be warranted based on clinical findings, MR pre and
post-contrast would be the imaging study of choice for further
assessment.
